# Patient Record
Sex: Female | Born: 1960 | Race: White | Hispanic: No | Marital: Married
Health system: Southern US, Community
[De-identification: ages and names within clinical notes are randomized; demographics above are authoritative.]

---

## 1998-05-26 ENCOUNTER — Other Ambulatory Visit: Admission: RE | Admit: 1998-05-26 | Discharge: 1998-05-26 | Payer: Self-pay | Admitting: Obstetrics and Gynecology

## 2000-09-05 ENCOUNTER — Other Ambulatory Visit: Admission: RE | Admit: 2000-09-05 | Discharge: 2000-09-05 | Payer: Self-pay | Admitting: Obstetrics and Gynecology

## 2001-10-09 ENCOUNTER — Other Ambulatory Visit: Admission: RE | Admit: 2001-10-09 | Discharge: 2001-10-09 | Payer: Self-pay | Admitting: Obstetrics and Gynecology

## 2002-01-09 ENCOUNTER — Encounter: Payer: Self-pay | Admitting: *Deleted

## 2002-01-09 ENCOUNTER — Ambulatory Visit (HOSPITAL_COMMUNITY): Admission: RE | Admit: 2002-01-09 | Discharge: 2002-01-09 | Payer: Self-pay | Admitting: *Deleted

## 2003-04-30 ENCOUNTER — Other Ambulatory Visit: Admission: RE | Admit: 2003-04-30 | Discharge: 2003-04-30 | Payer: Self-pay | Admitting: Obstetrics and Gynecology

## 2004-01-21 ENCOUNTER — Encounter: Admission: RE | Admit: 2004-01-21 | Discharge: 2004-01-21 | Payer: Self-pay | Admitting: Obstetrics and Gynecology

## 2004-05-25 ENCOUNTER — Other Ambulatory Visit: Admission: RE | Admit: 2004-05-25 | Discharge: 2004-05-25 | Payer: Self-pay | Admitting: Obstetrics and Gynecology

## 2004-05-31 ENCOUNTER — Encounter: Admission: RE | Admit: 2004-05-31 | Discharge: 2004-05-31 | Payer: Self-pay | Admitting: Obstetrics and Gynecology

## 2005-07-25 ENCOUNTER — Encounter: Admission: RE | Admit: 2005-07-25 | Discharge: 2005-07-25 | Payer: Self-pay | Admitting: Obstetrics and Gynecology

## 2005-07-28 ENCOUNTER — Other Ambulatory Visit: Admission: RE | Admit: 2005-07-28 | Discharge: 2005-07-28 | Payer: Self-pay | Admitting: Obstetrics and Gynecology

## 2006-01-29 ENCOUNTER — Encounter: Admission: RE | Admit: 2006-01-29 | Discharge: 2006-01-29 | Payer: Self-pay

## 2016-10-18 ENCOUNTER — Telehealth: Payer: Self-pay | Admitting: Nurse Practitioner

## 2016-10-18 NOTE — Telephone Encounter (Signed)
Patient called this morning to reply to prior messages left regarding insurance and payment for surgery. She reports talking to her insurance company and states they will "pay for part of it" and she will pay out of pocket for the remainder. She does not know the [employer] insurance will cover. She had pre-op testing with labs on 10/17/16 and plans to report for surgery as scheduled on 10/20/16. Will inform Melissa, NP of this update.

## 2016-10-25 ENCOUNTER — Other Ambulatory Visit: Payer: Self-pay | Admitting: Gynecologic Oncology
